# Patient Record
Sex: Female | Born: 2000 | Race: Black or African American | Hispanic: No | Marital: Single | Smoking: Never smoker
Health system: Southern US, Community
[De-identification: ages and names within clinical notes are randomized; demographics above are authoritative.]

---
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
## 2018-01-01 ENCOUNTER — Telehealth: Payer: Self-pay | Admitting: Family Medicine

## 2018-01-01 ENCOUNTER — Ambulatory Visit: Payer: Medicaid Other | Admitting: Family Medicine

## 2018-01-01 NOTE — Telephone Encounter (Signed)
Attempted to call patient x2 due to significant no-show rate.  Patient has no showed on last 3 appointments.  Attempted to call patient to see if there are any barriers to coming to appointments to see if we can help facilitate patient being more compliant with follow-ups.  No answer both times and phone does not seem to be set up for voicemail.  We will plan to send letter to patient discussing new no-show policy and dismissal policy.  Orpah ClintonSherin Avya Flavell, DO, PGY-2 [facility] 01/01/2018 9:50 AM

## 2018-03-29 ENCOUNTER — Emergency Department (HOSPITAL_COMMUNITY)
Admission: EM | Admit: 2018-03-29 | Discharge: 2018-03-29 | Disposition: A | Discharge status: Home / Self Care | Payer: Medicaid Other | Attending: Emergency Medicine | Admitting: Emergency Medicine

## 2018-03-29 ENCOUNTER — Other Ambulatory Visit: Payer: Self-pay

## 2018-03-29 ENCOUNTER — Encounter (HOSPITAL_COMMUNITY): Payer: Self-pay | Admitting: Emergency Medicine

## 2018-03-29 DIAGNOSIS — J069 Acute upper respiratory infection, unspecified: Secondary | ICD-10-CM | POA: Diagnosis not present

## 2018-03-29 DIAGNOSIS — Z79899 Other long term (current) drug therapy: Secondary | ICD-10-CM | POA: Insufficient documentation

## 2018-03-29 DIAGNOSIS — F17210 Nicotine dependence, cigarettes, uncomplicated: Secondary | ICD-10-CM | POA: Diagnosis not present

## 2018-03-29 DIAGNOSIS — R350 Frequency of micturition: Secondary | ICD-10-CM | POA: Insufficient documentation

## 2018-03-29 DIAGNOSIS — R05 Cough: Secondary | ICD-10-CM | POA: Diagnosis present

## 2018-03-29 LAB — URINALYSIS, ROUTINE W REFLEX MICROSCOPIC
Bilirubin Urine: NEGATIVE
Glucose, UA: NEGATIVE mg/dL
Hgb urine dipstick: NEGATIVE
Ketones, ur: 5 mg/dL — AB
Nitrite: NEGATIVE
Protein, ur: NEGATIVE mg/dL
Specific Gravity, Urine: 1.028 (ref 1.005–1.030)
pH: 5 (ref 5.0–8.0)

## 2018-03-29 MED ORDER — BENZONATATE 100 MG PO CAPS: 100.0000 mg | ORAL_CAPSULE | Freq: Three times a day (TID) | ORAL | 0 refills | Status: AC

## 2018-03-29 MED ORDER — ACETAMINOPHEN 500 MG PO TABS
1000.0000 mg | ORAL_TABLET | Freq: Once | ORAL | Status: AC
  Administered 2018-03-29: 1000 mg via ORAL
  Filled 2018-03-29: qty 2

## 2018-03-29 MED ORDER — FLUTICASONE PROPIONATE 50 MCG/ACT NA SUSP: 1.0000 | Freq: Every day | NASAL | 0 refills | Status: AC

## 2018-03-29 NOTE — ED Provider Notes (Signed)
[facility]   CSN: [id number] Arrival date & time: 03/29/18  1004    History   Chief Complaint Chief Complaint  Patient presents with  . Abdominal Pain    HPI Natasha Forbes is a 18 y.o. female presenting for evaluation of fever, headache, cough, lower abdominal pain, urinary frequency.   Patient states her symptoms began yesterday.  Symptoms began altogether.  She took some Tylenol last night, has not taken anything else for her symptoms.  She reports nasal congestion, and nonproductive cough.  Patient reports lower abdominal discomfort with urination and urinary frequency.  She denies ear pain, sore throat, chest pain, shortness of breath, nausea, vomiting, upper abdominal pain, or abnormal bowel movements.  She denies sick contacts.  She denies recent travel.  She denies history of asthma or COPD.  She denies tobacco use, but states that she smokes black and milds.  Patient states she has no other medical problems, takes no medications daily.     HPI  History reviewed. No pertinent past medical history.  Patient Active Problem List   Diagnosis Date Noted  . Abdominal pain, chronic, epigastric 07/22/2017  . Healthcare maintenance 02/18/2017    Past Surgical History:  Procedure Laterality Date  . 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      Home Medications    Prior to Admission medications   Medication Sig Start Date End Date Taking? Authorizing Provider  benzonatate (TESSALON) 100 MG capsule Take 1 capsule (100 mg total) by mouth every 8 (eight) hours. 03/29/18   Eleana Tocco, PA-C  famotidine (PEPCID) 20 MG tablet Take 1 tablet (20 mg total) by mouth 2 (two) times daily. 12/20/17   Street, Mercedes, PA-C  fluticasone (FLONASE) 50 MCG/ACT nasal spray Place 1 spray into both nostrils daily. 03/29/18   Vianny Schraeder, PA-C  omeprazole  (PRILOSEC) 20 MG capsule Take 1 po BID x 2 weeks then once a day 07/22/17   Nestor RampNeal, Sara L, MD  ondansetron (ZOFRAN ODT) 4 MG disintegrating tablet Take 1 tablet (4 mg total) by mouth every 8 (eight) hours as needed for nausea or vomiting. 12/20/17   Street, Mercedes, PA-C  polycarbophil (FIBERCON) 625 MG tablet Take 1 tablet (625 mg total) by mouth daily. 07/22/17   Nestor RampNeal, Sara L, MD    Family History Family History  Problem Relation Age of Onset  . Lupus Sister   . Diabetes Maternal Grandmother   . Hypertension Maternal Grandmother   . Prostate cancer Father     Social History Social History   Tobacco Use  . Smoking status: Never Smoker  . Smokeless tobacco: Never Used  Substance Use Topics  . Alcohol use: No    Frequency: Never  . Drug use: Yes    Types: Marijuana     Allergies   Patient has no known allergies.   Review of Systems Review of Systems  Constitutional: Positive for fever.  HENT: Positive for congestion.   Respiratory: Positive for cough.      Physical Exam Updated Vital Signs BP (!) 108/58 (BP Location: Right Arm)   Pulse 91   Temp 99.8 F (37.7 C) (Oral)   Resp 16   Ht 5\' 7"  (1.702 m)   Wt 68 kg   SpO2 99%   BMI 23.49 kg/m   Physical Exam Vitals signs and nursing note reviewed.  Constitutional:  General: She is not in acute distress.    Appearance: She is well-developed.     Comments: Appears nontoxic  HENT:     Head: Normocephalic and atraumatic.     Comments: OP clear without tonsillar swelling or exudate.  Uvula midline with equal palate rise.  TMs nonerythematous nonbulging bilaterally.  Nasal mucosal edema noted.    Right Ear: Tympanic membrane, ear canal and external ear normal.     Left Ear: Tympanic membrane, ear canal and external ear normal.     Nose: Mucosal edema present.     Right Sinus: No maxillary sinus tenderness or frontal sinus tenderness.     Left Sinus: No maxillary sinus tenderness or frontal sinus tenderness.      Mouth/Throat:     Pharynx: Uvula midline.     Tonsils: No tonsillar exudate.  Eyes:     Conjunctiva/sclera: Conjunctivae normal.     Pupils: Pupils are equal, round, and reactive to light.  Neck:     Musculoskeletal: Normal range of motion.  Cardiovascular:     Rate and Rhythm: Normal rate and regular rhythm.  Pulmonary:     Effort: Pulmonary effort is normal.     Breath sounds: Normal breath sounds. No decreased breath sounds, wheezing, rhonchi or rales.     Comments: Speaking in full sentences.  Clear lung sounds in all fields. Abdominal:     General: There is no distension.     Palpations: Abdomen is soft. There is no mass.     Tenderness: There is no abdominal tenderness. There is no guarding or rebound.     Comments: No tenderness palpation the abdomen.  Soft without rigidity, guarding, distention.  Negative rebound.  Musculoskeletal: Normal range of motion.  Lymphadenopathy:     Cervical: No cervical adenopathy.  Skin:    General: Skin is warm.     Capillary Refill: Capillary refill takes less than 2 seconds.  Neurological:     Mental Status: She is alert and oriented to person, place, and time.      ED Treatments / Results  Labs (all labs ordered are listed, but only abnormal results are displayed) Labs Reviewed  URINALYSIS, ROUTINE W REFLEX MICROSCOPIC - Abnormal; Notable for the following components:      Result Value   APPearance HAZY (*)    Ketones, ur 5 (*)    Leukocytes,Ua MODERATE (*)    Bacteria, UA FEW (*)    All other components within normal limits    EKG None  Radiology No results found.  Procedures Procedures (including critical care time)  Medications Ordered in ED Medications  acetaminophen (TYLENOL) tablet 1,000 mg (1,000 mg Oral Given 03/29/18 1055)     Initial Impression / Assessment and Plan / ED Course  I have reviewed the triage vital signs and the nursing notes.  Pertinent labs & imaging results that were available during my  care of the patient were reviewed by me and considered in my medical decision making (see chart for details).        Patient presenting with 1 day h/o URI symptoms.  Physical exam reassuring, patient appears nontoxic. HR and temp improved as expected with tylenol. Pulmonary exam reassuring.  Doubt pneumonia, strep, other bacterial infection, or peritonsillar abscess.  Likely viral URI. As pt is having some urinary sxs, will send UA.   UA without clear infection, and urine sample is contaminated with squamous. In the setting of URI sxs, low suspicion of acute UTI as well.  Will treat symptomatically.  Patient to follow-up as needed.  At this time, patient appears safe for discharge.  Return precautions given.  Patient states she understands and agrees to plan.   Final Clinical Impressions(s) / ED Diagnoses   Final diagnoses:  Upper respiratory tract infection, unspecified type    ED Discharge Orders         Ordered    fluticasone (FLONASE) 50 MCG/ACT nasal spray  Daily     03/29/18 1140    benzonatate (TESSALON) 100 MG capsule  Every 8 hours     03/29/18 1140           Hazelyn Kallen, PA-C 03/29/18 1352    Azalia Bilis, MD 03/29/18 1552

## 2018-03-29 NOTE — ED Triage Notes (Signed)
Pt in c/o fever and suprapubic abdominal pain, reports pain with urination, symptoms started yesterday

## 2018-03-29 NOTE — Discharge Instructions (Signed)
You likely have a viral illness.  This should be treated symptomatically. Use Tylenol or ibuprofen as needed for fevers or body aches. Use Flonase daily for nasal congestion and cough. Use tssalon Perles to help with cough. Make sure you stay well-hydrated with water. Wash your hands frequently to prevent spread of infection. Follow-up with your primary care doctor in 1 week if your symptoms are not improving. Return to the emergency room if you develop chest pain, difficulty breathing, or any new or worsening symptoms.

## 2018-04-01 ENCOUNTER — Other Ambulatory Visit: Payer: Self-pay

## 2018-04-01 ENCOUNTER — Ambulatory Visit (INDEPENDENT_AMBULATORY_CARE_PROVIDER_SITE_OTHER): Payer: Medicaid Other | Admitting: Family Medicine

## 2018-04-01 ENCOUNTER — Encounter: Payer: Self-pay | Admitting: Family Medicine

## 2018-04-01 VITALS — BP 100/61 | HR 95 | Temp 98.7°F | Wt 174.0 lb

## 2018-04-01 DIAGNOSIS — G8929 Other chronic pain: Secondary | ICD-10-CM

## 2018-04-01 DIAGNOSIS — R1013 Epigastric pain: Secondary | ICD-10-CM | POA: Diagnosis not present

## 2018-04-01 DIAGNOSIS — K529 Noninfective gastroenteritis and colitis, unspecified: Secondary | ICD-10-CM

## 2018-04-01 DIAGNOSIS — R109 Unspecified abdominal pain: Secondary | ICD-10-CM | POA: Diagnosis not present

## 2018-04-01 LAB — POCT URINE PREGNANCY: Preg Test, Ur: NEGATIVE

## 2018-04-01 MED ORDER — OMEPRAZOLE 20 MG PO CPDR: 20.0000 mg | DELAYED_RELEASE_CAPSULE | Freq: Every day | ORAL | 1 refills | Status: AC

## 2018-04-01 NOTE — Assessment & Plan Note (Signed)
Likely viral. She appears mildly dehydrated. I recommended oral hydration as tolerated. There should be improvement in few days. Return precaution discussed.

## 2018-04-01 NOTE — Progress Notes (Signed)
Subjective:     Patient ID: Natasha Forbes, female   DOB: May 12, 2000, 18 y.o.   MRN: [id number]  Diarrhea   This is a new problem. The current episode started in the past 7 days (Started 4 days ago). The problem occurs 2 to 4 times per day. The problem has been gradually worsening. Diarrhea characteristics: No blood in the stool,brownish stool, alternating pasty and watery. The patient states that diarrhea awakens her from sleep. Associated symptoms include abdominal pain, a fever and vomiting. Pertinent negatives include no coughing. Associated symptoms comments: Last vomitus was yesterday. C/O associated epigastric pain for months and has been seen multiple times for this. She denies urinary symptoms.. Nothing aggravates the symptoms. There are no known risk factors. Treatments tried: Used some medication in the past, last used 2 moths ago.  The treatment provided moderate relief. There is no history of irritable bowel syndrome or a recent abdominal surgery.  Emesis   Associated symptoms include abdominal pain, diarrhea and a fever. Pertinent negatives include no coughing.    Current Outpatient Medications on File Prior to Visit  Medication Sig Dispense Refill  . benzonatate (TESSALON) 100 MG capsule Take 1 capsule (100 mg total) by mouth every 8 (eight) hours. (Patient not taking: Reported on 04/01/2018) 21 capsule 0  . famotidine (PEPCID) 20 MG tablet Take 1 tablet (20 mg total) by mouth 2 (two) times daily. (Patient not taking: Reported on 04/01/2018) 60 tablet 3  . fluticasone (FLONASE) 50 MCG/ACT nasal spray Place 1 spray into both nostrils daily. (Patient not taking: Reported on 04/01/2018) 16 g 0  . omeprazole (PRILOSEC) 20 MG capsule Take 1 po BID x 2 weeks then once a day (Patient not taking: Reported on 04/01/2018) 30 capsule 1  . ondansetron (ZOFRAN ODT) 4 MG disintegrating tablet Take 1 tablet (4 mg total) by mouth every 8 (eight) hours as needed for nausea or vomiting. (Patient not  taking: Reported on 04/01/2018) 15 tablet 0  . polycarbophil (FIBERCON) 625 MG tablet Take 1 tablet (625 mg total) by mouth daily. (Patient not taking: Reported on 04/01/2018) 90 tablet 3   No current facility-administered medications on file prior to visit.    History reviewed. No pertinent past medical history. Vitals:   04/01/18 1400  BP: 100/61  Pulse: 95  Temp: 98.7 F (37.1 C)  TempSrc: Oral  SpO2: 98%  Weight: 174 lb (78.9 kg)    Review of Systems  Constitutional: Positive for fever.  Respiratory: Negative.  Negative for cough.   Cardiovascular: Negative.   Gastrointestinal: Positive for abdominal pain, diarrhea and vomiting.  Genitourinary: Negative.   Neurological: Negative.   All other systems reviewed and are negative.      Objective:   Physical Exam Vitals signs and nursing note reviewed.  Constitutional:      Appearance: She is not ill-appearing.  Cardiovascular:     Rate and Rhythm: Normal rate and regular rhythm.     Pulses: Normal pulses.     Heart sounds: Normal heart sounds. No murmur.  Pulmonary:     Effort: Pulmonary effort is normal. No respiratory distress.     Breath sounds: Normal breath sounds. No stridor. No wheezing or rhonchi.  Abdominal:     General: Abdomen is flat. Bowel sounds are normal.     Tenderness: There is abdominal tenderness in the epigastric area.  Neurological:     Mental Status: She is alert.        Assessment:  Diarrhea Epigastric pain    Plan:     Check problem list

## 2018-04-01 NOTE — Assessment & Plan Note (Signed)
Likely GERD. Upreg negative. Recent UA in the ED neg and she denies urinary symptoms. Check for H.Pylori. Urea breath test completed today. I will contact her with the result. I will restart PPI pending result. I refilled her med.

## 2018-04-01 NOTE — Patient Instructions (Signed)

## 2018-04-03 ENCOUNTER — Telehealth: Payer: Self-pay | Admitting: Family Medicine

## 2018-04-03 LAB — H. PYLORI BREATH TEST: H pylori Breath Test: NEGATIVE

## 2018-04-03 NOTE — Telephone Encounter (Signed)
HIPAA compliant call back message left.    Please let the patient know that her breath test is negative. I have escribed PPI, she can restart med.  F/U with PCP soon for further management and GI referral if needed.

## 2018-04-08 ENCOUNTER — Telehealth: Payer: Self-pay | Admitting: Family Medicine

## 2018-04-08 NOTE — Telephone Encounter (Signed)
Received phone message for this patient with concerns of requesting lab results. Natasha Forbes states she was calling into received the results of lab work she recently had.  Patient was seen by Dr. Lum Babe on 3/12.  At that time patient received H. pylori test as well as pregnancy test.  Per chart review it appears Dr. Lum Babe had called patient and left a HIPAA compliant message stating that lab results were negative and to start a PPI.  Was advised to follow-up with PCP for further management and referral to GI if needed.  Patient made aware of results and new prescription.  Advised that if patient continues to have symptoms to call back his she may need to be seen or have referral to GI specialist.  Patient appreciative of call and advice. Strict return precautions given.  [name], DO  PGY-2

## 2019-01-14 IMAGING — DX DG CHEST 2V
2 series · 2 of 2 positions shown · non-contrast
Comparison: None.

CLINICAL DATA: Chest pain today.

EXAM:
CHEST  2 VIEW

[chest pa]
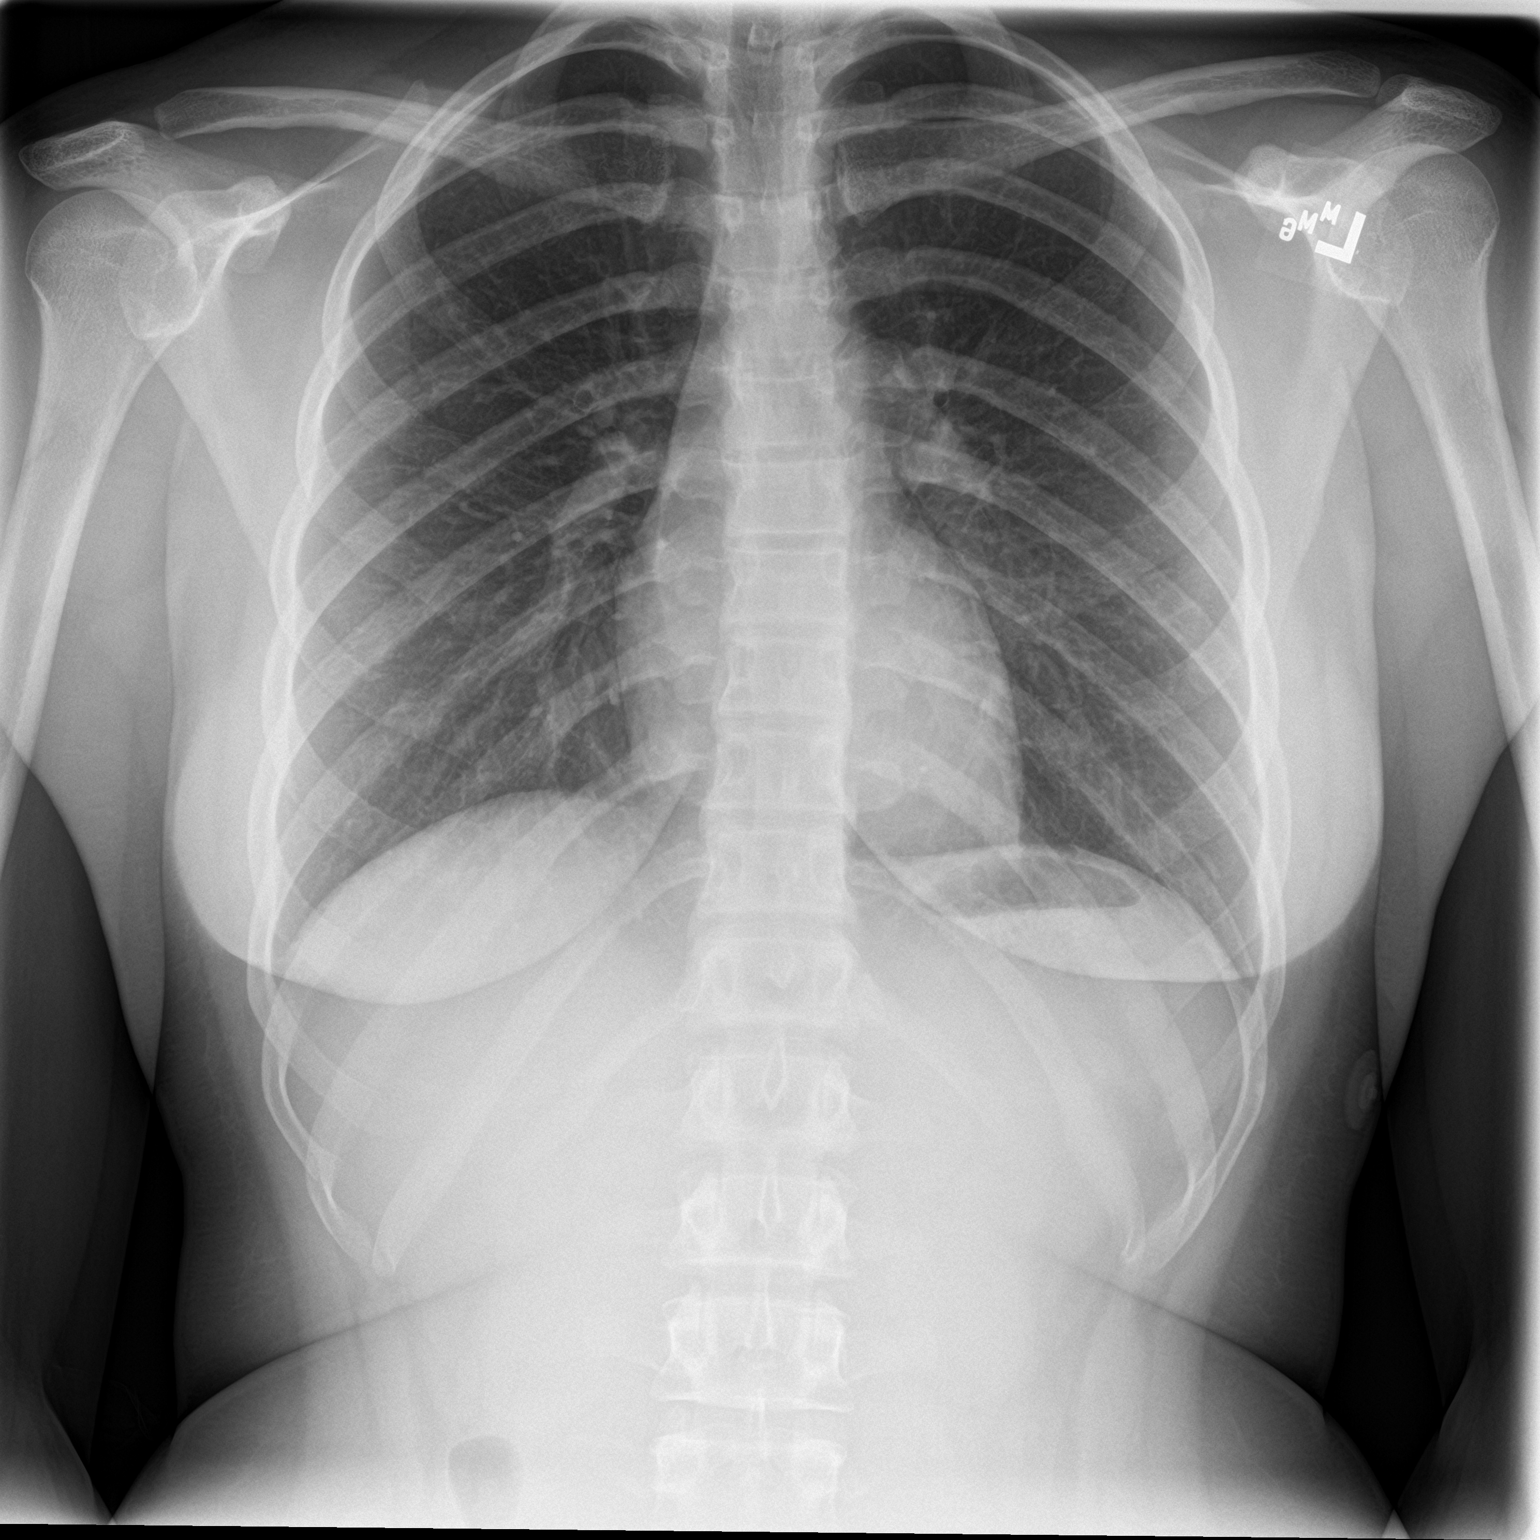

[chest lat]
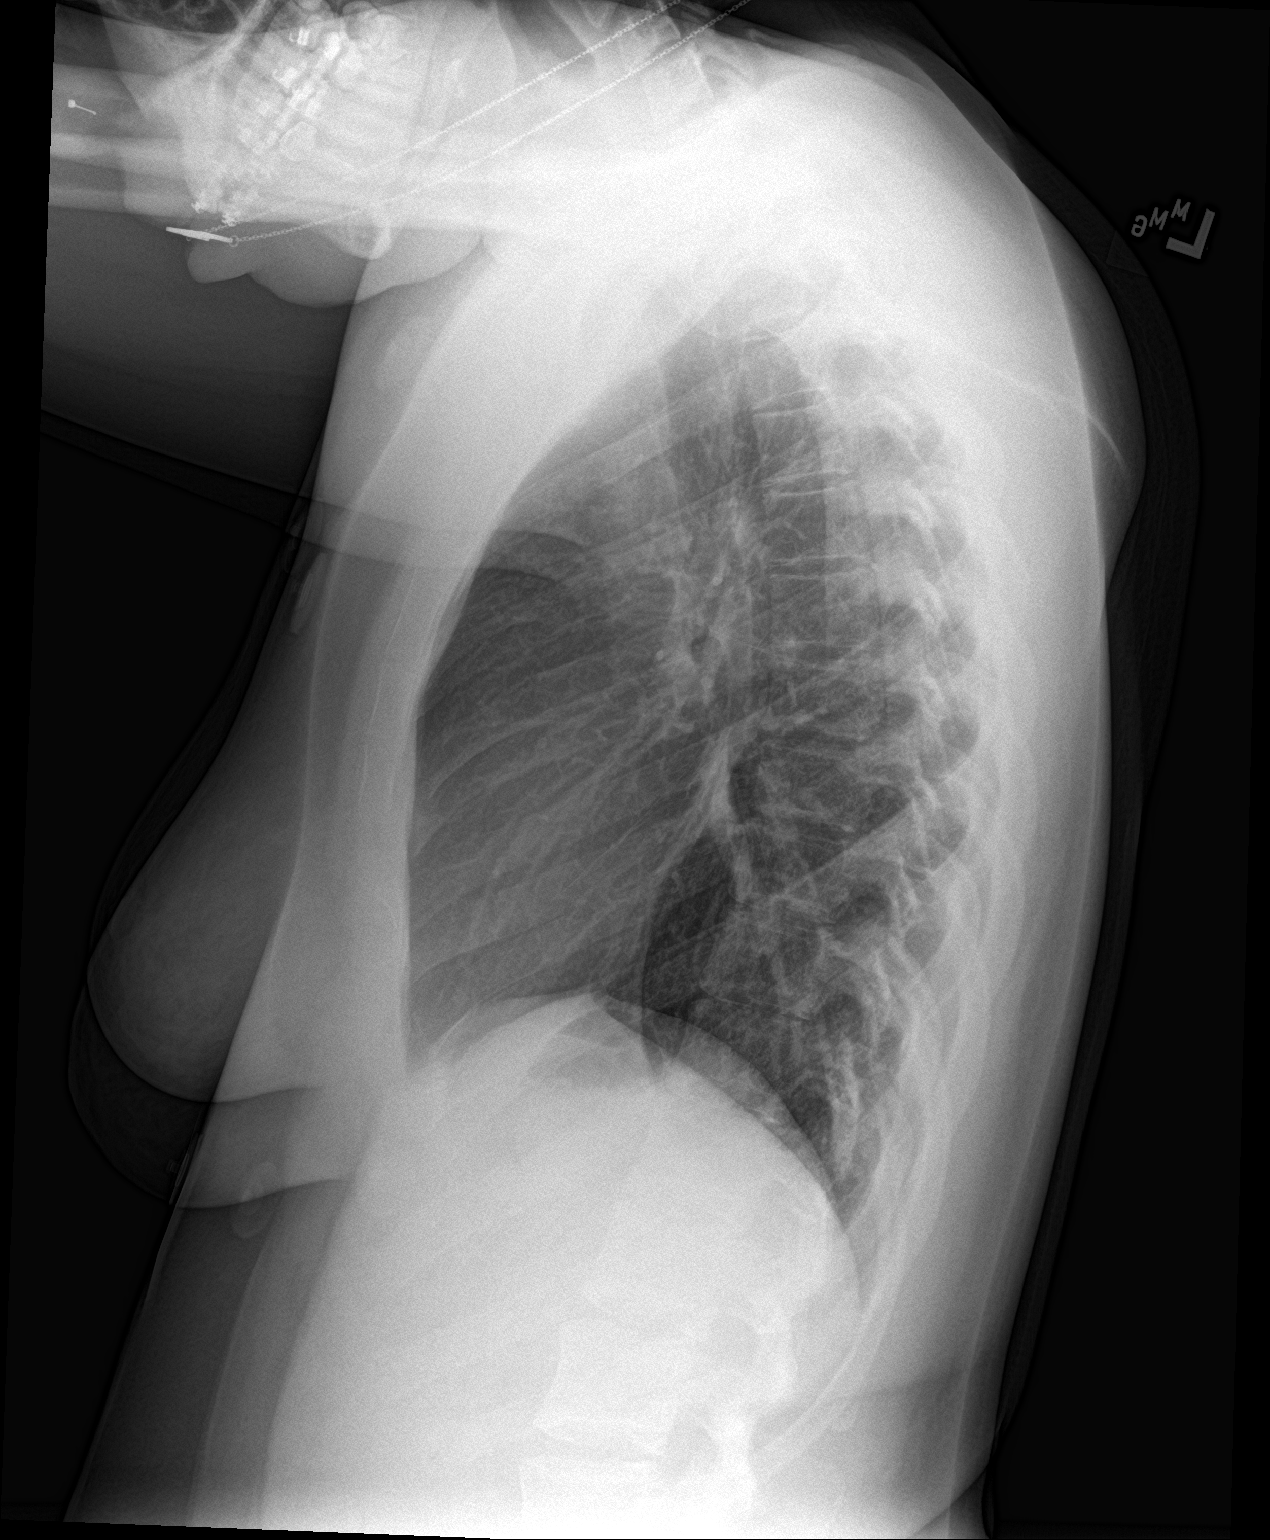

[2 of 2 positions shown; findings below may reference images not displayed]

FINDINGS: The cardiomediastinal contours are normal. The lungs are clear.
Pulmonary vasculature is normal. No consolidation, pleural effusion,
or pneumothorax. No acute osseous abnormalities are seen.
IMPRESSION: Unremarkable radiographs of the chest.

## 2022-09-04 ENCOUNTER — Emergency Department (HOSPITAL_COMMUNITY)
Admission: EM | Admit: 2022-09-04 | Discharge: 2022-09-04 | Disposition: A | Discharge status: Home / Self Care | Payer: Medicaid Other | Attending: Emergency Medicine | Admitting: Emergency Medicine

## 2022-09-04 ENCOUNTER — Emergency Department (HOSPITAL_COMMUNITY): Payer: Medicaid Other

## 2022-09-04 ENCOUNTER — Encounter (HOSPITAL_COMMUNITY): Payer: Self-pay | Admitting: Emergency Medicine

## 2022-09-04 ENCOUNTER — Other Ambulatory Visit: Payer: Self-pay

## 2022-09-04 DIAGNOSIS — Z5329 Procedure and treatment not carried out because of patient's decision for other reasons: Secondary | ICD-10-CM | POA: Diagnosis not present

## 2022-09-04 DIAGNOSIS — Y92410 Unspecified street and highway as the place of occurrence of the external cause: Secondary | ICD-10-CM | POA: Insufficient documentation

## 2022-09-04 DIAGNOSIS — R519 Headache, unspecified: Secondary | ICD-10-CM | POA: Insufficient documentation

## 2022-09-04 DIAGNOSIS — H53149 Visual discomfort, unspecified: Secondary | ICD-10-CM | POA: Diagnosis not present

## 2022-09-04 MED ORDER — ACETAMINOPHEN 325 MG PO TABS
650.0000 mg | ORAL_TABLET | Freq: Once | ORAL | Status: AC
  Administered 2022-09-04: 650 mg via ORAL
  Filled 2022-09-04: qty 2
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
## 2023-11-03 ENCOUNTER — Encounter: Admitting: Obstetrics and Gynecology

## 2023-12-07 ENCOUNTER — Encounter: Payer: Self-pay | Admitting: Obstetrics

## 2023-12-07 ENCOUNTER — Other Ambulatory Visit (HOSPITAL_COMMUNITY)
Admission: RE | Admit: 2023-12-07 | Discharge: 2023-12-07 | Disposition: A | Discharge status: Home / Self Care | Source: Ambulatory Visit | Attending: Obstetrics | Admitting: Obstetrics

## 2023-12-07 ENCOUNTER — Ambulatory Visit: Admitting: Obstetrics

## 2023-12-07 VITALS — BP 124/81 | HR 102 | Ht 67.5 in | Wt 149.1 lb

## 2023-12-07 DIAGNOSIS — L738 Other specified follicular disorders: Secondary | ICD-10-CM

## 2023-12-07 DIAGNOSIS — Z3009 Encounter for other general counseling and advice on contraception: Secondary | ICD-10-CM

## 2023-12-07 DIAGNOSIS — Z01419 Encounter for gynecological examination (general) (routine) without abnormal findings: Secondary | ICD-10-CM | POA: Insufficient documentation

## 2023-12-07 DIAGNOSIS — N898 Other specified noninflammatory disorders of vagina: Secondary | ICD-10-CM | POA: Diagnosis not present

## 2023-12-07 MED ORDER — CLINDAMYCIN HCL 300 MG PO CAPS: 300.0000 mg | ORAL_CAPSULE | Freq: Three times a day (TID) | ORAL | 2 refills | Status: AC

## 2023-12-07 NOTE — Progress Notes (Signed)
 Pt presents for vaginal bump. Pain, itching and drainage. Pt does not remember her last pap but states that it was done at [facility]. Pt declines bc.

## 2023-12-07 NOTE — Progress Notes (Addendum)
 Subjective:        Natasha Forbes is a 23 y.o. female here for a routine exam.  Current complaints: Bump on vagina.    Personal health questionnaire:  Is patient Ashkenazi Jewish, have a family history of breast and/or ovarian cancer: no Is there a family history of uterine cancer diagnosed at age < 60, gastrointestinal cancer, urinary tract cancer, family member who is a Personnel Officer syndrome-associated carrier: no Is the patient overweight and hypertensive, family history of diabetes, personal history of gestational diabetes, preeclampsia or PCOS: no Is patient over 38, have PCOS,  family history of premature CHD under age 32, diabetes, smoke, have hypertension or peripheral artery disease:  no At any time, has a partner hit, kicked or otherwise hurt or frightened you?: no Over the past 2 weeks, have you felt down, depressed or hopeless?: no Over the past 2 weeks, have you felt little interest or pleasure in doing things?:no   Gynecologic History Patient's last menstrual period was 11/30/2023 (exact date). Contraception: abstinence Last Pap: none. Results were: none Last mammogram: n/a. Results were: n/a  Obstetric History OB History  No obstetric history on file.    History reviewed. No pertinent past medical history.  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 (Patient not taking: Reported on 12/07/2023), Disp: 21 capsule, Rfl: 0   famotidine  (PEPCID ) 20 MG tablet, Take 1 tablet (20 mg total) by mouth 2 (two) times daily. (Patient not taking: Reported on 12/07/2023), Disp: 60 tablet, Rfl: 3   fluticasone  (FLONASE ) 50 MCG/ACT nasal spray, Place  1 spray into both nostrils daily. (Patient not taking: Reported on 12/07/2023), Disp: 16 g, Rfl: 0   omeprazole  (PRILOSEC) 20 MG capsule, Take 1 capsule (20 mg total) by mouth daily. (Patient not taking: Reported on 12/07/2023), Disp: 30 capsule, Rfl: 1   ondansetron  (ZOFRAN  ODT) 4 MG disintegrating tablet, Take 1 tablet (4 mg total) by mouth every 8 (eight) hours as needed for nausea or vomiting. (Patient not taking: Reported on 12/07/2023), Disp: 15 tablet, Rfl: 0   polycarbophil (FIBERCON) 625 MG tablet, Take 1 tablet (625 mg total) by mouth daily. (Patient not taking: Reported on 12/07/2023), Disp: 90 tablet, Rfl: 3 No Known Allergies  Social History   Tobacco Use   Smoking status: Never   Smokeless tobacco: Never  Substance Use Topics   Alcohol use: No    Family History  Problem Relation Age of Onset   Lupus Sister    Diabetes Maternal Grandmother    Hypertension Maternal Grandmother    Prostate cancer Father       Review of Systems  Constitutional: negative for fatigue and weight loss Respiratory: negative for cough and wheezing Cardiovascular: negative for chest pain, fatigue and palpitations Gastrointestinal: negative for abdominal pain and change in bowel habits Musculoskeletal:negative for myalgias Neurological: negative for gait problems and tremors Behavioral/Psych: negative for abusive relationship, depression Endocrine: negative for temperature intolerance    Genitourinary: positive for vaginal discharge and pubic hair bumps after shaving.  .  negative for abnormal menstrual periods, genital lesions, hot flashes, sexual problems  Integument/breast: negative for breast lump, breast tenderness,  nipple discharge and skin lesion(s)    Objective:       BP 124/81   Pulse (!) 102   Ht 5' 7.5 (1.715 m)   Wt 149 lb 1.6 oz (67.6 kg)   LMP 11/30/2023 (Exact Date)   BMI 23.01 kg/m  General:   Alert and no distress  Skin:   no rash or abnormalities  Lungs:   clear to  auscultation bilaterally  Heart:   regular rate and rhythm, S1, S2 normal, no murmur, click, rub or gallop  Breasts:   normal without suspicious masses, skin or nipple changes or axillary nodes  Abdomen:  normal findings: no organomegaly, soft, non-tender and no hernia  Pelvis:  External genitalia: normal general appearance Urinary system: urethral meatus normal and bladder without fullness, nontender Vaginal: normal without tenderness, induration or masses Cervix: normal appearance Adnexa: normal bimanual exam Uterus: anteverted and non-tender, normal size   Lab Review Urine pregnancy test Labs reviewed yes Radiologic studies reviewed no  I have spent a total of 20 minutes of face-to-face time, excluding clinical staff time, reviewing notes and preparing to see patient, ordering tests and/or medications, and counseling the patient.   Assessment:    .1. Encounter for gynecological examination with Papanicolaou smear of cervix (Primary) Rx: - Cytology - PAP( [place])  2. Vaginal discharge Rx: - Cervicovaginal ancillary only( [place])  3. Folliculitis barbae - stop shaving and use a depilatory Rx: - clindamycin (CLEOCIN) 300 MG capsule; Take 1 capsule (300 mg total) by mouth 3 (three) times daily.  Dispense: 21 capsule; Refill: 2  4. Encounter for other general counseling or advice on contraception - declines contraception.  Condoms recommended for STD prevention.     Plan:    Education reviewed: calcium  supplements, depression evaluation, low fat, low cholesterol diet, safe sex/STD prevention, self breast exams, and weight bearing exercise. Follow up in: 1 year.   Meds ordered this encounter  Medications   clindamycin (CLEOCIN) 300 MG capsule    Sig: Take 1 capsule (300 mg total) by mouth 3 (three) times daily.    Dispense:  21 capsule    Refill:  2     CARLIN RONAL CENTERS, MD, FACOG Attending Obstetrician & Gynecologist, [facility] Center for [facility], [facility], [place] 12/07/2023

## 2023-12-08 LAB — CYTOLOGY - PAP
Adequacy: ABSENT
Diagnosis: NEGATIVE

## 2023-12-09 ENCOUNTER — Ambulatory Visit: Payer: Self-pay | Admitting: Obstetrics

## 2023-12-09 LAB — CERVICOVAGINAL ANCILLARY ONLY
Bacterial Vaginitis (gardnerella): POSITIVE — AB
Chlamydia: NEGATIVE
Comment: NEGATIVE
Comment: NEGATIVE
Comment: NEGATIVE
Comment: NEGATIVE
Comment: NEGATIVE
Comment: NORMAL
Neisseria Gonorrhea: NEGATIVE

## 2023-12-21 ENCOUNTER — Encounter: Admitting: Obstetrics and Gynecology

## 2024-03-16 ENCOUNTER — Ambulatory Visit: Admitting: Physician Assistant
